# Patient Record
Sex: Female | Born: 1960 | Race: White | Hispanic: No | Marital: Married
Health system: Southern US, Community
[De-identification: ages and names within clinical notes are randomized; demographics above are authoritative.]

---

## 2002-08-24 ENCOUNTER — Emergency Department (HOSPITAL_COMMUNITY): Admission: EM | Admit: 2002-08-24 | Discharge: 2002-08-25 | Payer: Self-pay | Admitting: Emergency Medicine

## 2002-08-24 ENCOUNTER — Encounter: Payer: Self-pay | Admitting: Emergency Medicine

## 2003-04-15 ENCOUNTER — Other Ambulatory Visit: Admission: RE | Admit: 2003-04-15 | Discharge: 2003-04-15 | Payer: Self-pay | Admitting: Obstetrics and Gynecology

## 2003-04-25 ENCOUNTER — Encounter: Admission: RE | Admit: 2003-04-25 | Discharge: 2003-04-25 | Payer: Self-pay | Admitting: Obstetrics and Gynecology

## 2003-04-25 ENCOUNTER — Encounter: Payer: Self-pay | Admitting: Obstetrics and Gynecology

## 2003-05-06 ENCOUNTER — Other Ambulatory Visit: Admission: RE | Admit: 2003-05-06 | Discharge: 2003-05-06 | Payer: Self-pay | Admitting: Obstetrics and Gynecology

## 2004-04-01 ENCOUNTER — Other Ambulatory Visit: Admission: RE | Admit: 2004-04-01 | Discharge: 2004-04-01 | Payer: Self-pay | Admitting: Obstetrics and Gynecology

## 2005-04-01 ENCOUNTER — Encounter: Admission: RE | Admit: 2005-04-01 | Discharge: 2005-04-01 | Payer: Self-pay | Admitting: Obstetrics and Gynecology

## 2005-06-17 ENCOUNTER — Other Ambulatory Visit: Admission: RE | Admit: 2005-06-17 | Discharge: 2005-06-17 | Payer: Self-pay | Admitting: Obstetrics and Gynecology

## 2007-05-31 ENCOUNTER — Encounter: Admission: RE | Admit: 2007-05-31 | Discharge: 2007-05-31 | Payer: Self-pay | Admitting: Obstetrics and Gynecology

## 2008-05-31 ENCOUNTER — Encounter: Admission: RE | Admit: 2008-05-31 | Discharge: 2008-05-31 | Payer: Self-pay | Admitting: Obstetrics and Gynecology

## 2009-07-24 ENCOUNTER — Encounter: Admission: RE | Admit: 2009-07-24 | Discharge: 2009-07-24 | Payer: Self-pay | Admitting: Obstetrics and Gynecology

## 2010-08-31 ENCOUNTER — Other Ambulatory Visit: Payer: Self-pay | Admitting: Obstetrics and Gynecology

## 2010-08-31 DIAGNOSIS — Z1231 Encounter for screening mammogram for malignant neoplasm of breast: Secondary | ICD-10-CM

## 2010-09-09 ENCOUNTER — Ambulatory Visit
Admission: RE | Admit: 2010-09-09 | Discharge: 2010-09-09 | Disposition: A | Discharge status: Home / Self Care | Payer: Commercial Managed Care - PPO | Source: Ambulatory Visit | Attending: Obstetrics and Gynecology | Admitting: Obstetrics and Gynecology

## 2010-09-09 DIAGNOSIS — Z1231 Encounter for screening mammogram for malignant neoplasm of breast: Secondary | ICD-10-CM

## 2012-04-17 ENCOUNTER — Other Ambulatory Visit: Payer: Self-pay | Admitting: Obstetrics and Gynecology

## 2012-04-17 DIAGNOSIS — Z1231 Encounter for screening mammogram for malignant neoplasm of breast: Secondary | ICD-10-CM

## 2012-04-27 ENCOUNTER — Ambulatory Visit
Admission: RE | Admit: 2012-04-27 | Discharge: 2012-04-27 | Disposition: A | Discharge status: Home / Self Care | Payer: 59 | Source: Ambulatory Visit | Attending: Obstetrics and Gynecology | Admitting: Obstetrics and Gynecology

## 2012-04-27 DIAGNOSIS — Z1231 Encounter for screening mammogram for malignant neoplasm of breast: Secondary | ICD-10-CM

## 2013-04-24 ENCOUNTER — Other Ambulatory Visit: Payer: Self-pay

## 2013-04-24 DIAGNOSIS — Z1231 Encounter for screening mammogram for malignant neoplasm of breast: Secondary | ICD-10-CM

## 2013-05-18 ENCOUNTER — Ambulatory Visit
Admission: RE | Admit: 2013-05-18 | Discharge: 2013-05-18 | Disposition: A | Discharge status: Home / Self Care | Payer: 59 | Source: Ambulatory Visit

## 2013-05-18 DIAGNOSIS — Z1231 Encounter for screening mammogram for malignant neoplasm of breast: Secondary | ICD-10-CM

## 2014-07-30 ENCOUNTER — Other Ambulatory Visit: Payer: Self-pay

## 2014-07-30 DIAGNOSIS — Z1231 Encounter for screening mammogram for malignant neoplasm of breast: Secondary | ICD-10-CM

## 2014-08-05 ENCOUNTER — Ambulatory Visit
Admission: RE | Admit: 2014-08-05 | Discharge: 2014-08-05 | Disposition: A | Discharge status: Home / Self Care | Payer: 59 | Source: Ambulatory Visit

## 2014-08-05 DIAGNOSIS — Z1231 Encounter for screening mammogram for malignant neoplasm of breast: Secondary | ICD-10-CM

## 2015-10-02 ENCOUNTER — Other Ambulatory Visit: Payer: Self-pay

## 2015-10-02 DIAGNOSIS — Z1231 Encounter for screening mammogram for malignant neoplasm of breast: Secondary | ICD-10-CM

## 2015-10-08 ENCOUNTER — Ambulatory Visit
Admission: RE | Admit: 2015-10-08 | Discharge: 2015-10-08 | Disposition: A | Discharge status: Home / Self Care | Payer: Managed Care, Other (non HMO) | Source: Ambulatory Visit

## 2015-10-08 DIAGNOSIS — Z1231 Encounter for screening mammogram for malignant neoplasm of breast: Secondary | ICD-10-CM

## 2016-11-23 ENCOUNTER — Other Ambulatory Visit: Payer: Self-pay | Admitting: Obstetrics and Gynecology

## 2016-11-23 DIAGNOSIS — Z1231 Encounter for screening mammogram for malignant neoplasm of breast: Secondary | ICD-10-CM

## 2016-12-14 ENCOUNTER — Ambulatory Visit
Admission: RE | Admit: 2016-12-14 | Discharge: 2016-12-14 | Disposition: A | Discharge status: Home / Self Care | Payer: BLUE CROSS/BLUE SHIELD | Source: Ambulatory Visit | Attending: Obstetrics and Gynecology | Admitting: Obstetrics and Gynecology

## 2016-12-14 DIAGNOSIS — Z1231 Encounter for screening mammogram for malignant neoplasm of breast: Secondary | ICD-10-CM

## 2017-11-10 ENCOUNTER — Other Ambulatory Visit: Payer: Self-pay | Admitting: Obstetrics and Gynecology

## 2017-11-10 DIAGNOSIS — Z1231 Encounter for screening mammogram for malignant neoplasm of breast: Secondary | ICD-10-CM

## 2017-12-15 ENCOUNTER — Ambulatory Visit
Admission: RE | Admit: 2017-12-15 | Discharge: 2017-12-15 | Disposition: A | Discharge status: Home / Self Care | Payer: BLUE CROSS/BLUE SHIELD | Source: Ambulatory Visit | Attending: Obstetrics and Gynecology | Admitting: Obstetrics and Gynecology

## 2017-12-15 DIAGNOSIS — Z1231 Encounter for screening mammogram for malignant neoplasm of breast: Secondary | ICD-10-CM

## 2018-07-06 DIAGNOSIS — R87610 Atypical squamous cells of undetermined significance on cytologic smear of cervix (ASC-US): Secondary | ICD-10-CM | POA: Diagnosis not present

## 2018-07-06 DIAGNOSIS — Z01419 Encounter for gynecological examination (general) (routine) without abnormal findings: Secondary | ICD-10-CM | POA: Diagnosis not present

## 2018-11-29 ENCOUNTER — Other Ambulatory Visit: Payer: Self-pay | Admitting: Obstetrics and Gynecology

## 2018-11-29 DIAGNOSIS — Z1231 Encounter for screening mammogram for malignant neoplasm of breast: Secondary | ICD-10-CM

## 2019-02-05 ENCOUNTER — Other Ambulatory Visit: Payer: Self-pay

## 2019-02-05 ENCOUNTER — Ambulatory Visit
Admission: RE | Admit: 2019-02-05 | Discharge: 2019-02-05 | Disposition: A | Discharge status: Home / Self Care | Payer: BLUE CROSS/BLUE SHIELD | Source: Ambulatory Visit | Attending: Obstetrics and Gynecology | Admitting: Obstetrics and Gynecology

## 2019-02-05 DIAGNOSIS — Z1231 Encounter for screening mammogram for malignant neoplasm of breast: Secondary | ICD-10-CM | POA: Diagnosis not present

## 2019-02-19 DIAGNOSIS — H2513 Age-related nuclear cataract, bilateral: Secondary | ICD-10-CM | POA: Diagnosis not present

## 2019-02-19 DIAGNOSIS — B399 Histoplasmosis, unspecified: Secondary | ICD-10-CM | POA: Diagnosis not present

## 2019-02-19 DIAGNOSIS — H5213 Myopia, bilateral: Secondary | ICD-10-CM | POA: Diagnosis not present

## 2019-05-21 DIAGNOSIS — Z20828 Contact with and (suspected) exposure to other viral communicable diseases: Secondary | ICD-10-CM | POA: Diagnosis not present

## 2020-01-24 ENCOUNTER — Other Ambulatory Visit: Payer: Self-pay | Admitting: Obstetrics and Gynecology

## 2020-01-24 DIAGNOSIS — Z1231 Encounter for screening mammogram for malignant neoplasm of breast: Secondary | ICD-10-CM

## 2020-02-08 ENCOUNTER — Ambulatory Visit
Admission: RE | Admit: 2020-02-08 | Discharge: 2020-02-08 | Disposition: A | Discharge status: Home / Self Care | Payer: BC Managed Care – PPO | Source: Ambulatory Visit | Attending: Obstetrics and Gynecology | Admitting: Obstetrics and Gynecology

## 2020-02-08 ENCOUNTER — Other Ambulatory Visit: Payer: Self-pay

## 2020-02-08 DIAGNOSIS — Z1231 Encounter for screening mammogram for malignant neoplasm of breast: Secondary | ICD-10-CM

## 2020-04-10 DIAGNOSIS — Z01419 Encounter for gynecological examination (general) (routine) without abnormal findings: Secondary | ICD-10-CM | POA: Diagnosis not present

## 2020-04-14 DIAGNOSIS — M25571 Pain in right ankle and joints of right foot: Secondary | ICD-10-CM | POA: Diagnosis not present

## 2020-04-21 ENCOUNTER — Ambulatory Visit: Payer: BC Managed Care – PPO | Admitting: Podiatry

## 2020-05-08 DIAGNOSIS — E663 Overweight: Secondary | ICD-10-CM | POA: Diagnosis not present

## 2020-05-08 DIAGNOSIS — F439 Reaction to severe stress, unspecified: Secondary | ICD-10-CM | POA: Diagnosis not present

## 2020-05-08 DIAGNOSIS — I10 Essential (primary) hypertension: Secondary | ICD-10-CM | POA: Diagnosis not present

## 2020-05-08 DIAGNOSIS — S93409A Sprain of unspecified ligament of unspecified ankle, initial encounter: Secondary | ICD-10-CM | POA: Diagnosis not present

## 2020-05-26 DIAGNOSIS — E663 Overweight: Secondary | ICD-10-CM | POA: Diagnosis not present

## 2020-05-26 DIAGNOSIS — Z713 Dietary counseling and surveillance: Secondary | ICD-10-CM | POA: Diagnosis not present

## 2020-05-26 DIAGNOSIS — I10 Essential (primary) hypertension: Secondary | ICD-10-CM | POA: Diagnosis not present

## 2020-06-05 DIAGNOSIS — I10 Essential (primary) hypertension: Secondary | ICD-10-CM | POA: Diagnosis not present

## 2020-06-05 DIAGNOSIS — E663 Overweight: Secondary | ICD-10-CM | POA: Diagnosis not present

## 2020-06-18 DIAGNOSIS — I10 Essential (primary) hypertension: Secondary | ICD-10-CM | POA: Diagnosis not present

## 2020-06-18 DIAGNOSIS — E782 Mixed hyperlipidemia: Secondary | ICD-10-CM | POA: Diagnosis not present

## 2020-06-18 DIAGNOSIS — R739 Hyperglycemia, unspecified: Secondary | ICD-10-CM | POA: Diagnosis not present

## 2020-06-18 DIAGNOSIS — E663 Overweight: Secondary | ICD-10-CM | POA: Diagnosis not present

## 2020-06-26 DIAGNOSIS — E669 Obesity, unspecified: Secondary | ICD-10-CM | POA: Diagnosis not present

## 2020-06-26 DIAGNOSIS — R7303 Prediabetes: Secondary | ICD-10-CM | POA: Diagnosis not present

## 2020-06-26 DIAGNOSIS — E785 Hyperlipidemia, unspecified: Secondary | ICD-10-CM | POA: Diagnosis not present

## 2020-06-26 DIAGNOSIS — I10 Essential (primary) hypertension: Secondary | ICD-10-CM | POA: Diagnosis not present

## 2020-08-07 DIAGNOSIS — R7303 Prediabetes: Secondary | ICD-10-CM | POA: Diagnosis not present

## 2020-08-07 DIAGNOSIS — E669 Obesity, unspecified: Secondary | ICD-10-CM | POA: Diagnosis not present

## 2020-08-07 DIAGNOSIS — E785 Hyperlipidemia, unspecified: Secondary | ICD-10-CM | POA: Diagnosis not present

## 2020-08-07 DIAGNOSIS — I10 Essential (primary) hypertension: Secondary | ICD-10-CM | POA: Diagnosis not present

## 2020-09-10 DIAGNOSIS — E669 Obesity, unspecified: Secondary | ICD-10-CM | POA: Diagnosis not present

## 2020-09-10 DIAGNOSIS — E785 Hyperlipidemia, unspecified: Secondary | ICD-10-CM | POA: Diagnosis not present

## 2020-09-10 DIAGNOSIS — I10 Essential (primary) hypertension: Secondary | ICD-10-CM | POA: Diagnosis not present

## 2020-09-10 DIAGNOSIS — R7303 Prediabetes: Secondary | ICD-10-CM | POA: Diagnosis not present

## 2020-10-20 DIAGNOSIS — I10 Essential (primary) hypertension: Secondary | ICD-10-CM | POA: Diagnosis not present

## 2020-10-20 DIAGNOSIS — E785 Hyperlipidemia, unspecified: Secondary | ICD-10-CM | POA: Diagnosis not present

## 2020-10-20 DIAGNOSIS — E663 Overweight: Secondary | ICD-10-CM | POA: Diagnosis not present

## 2020-10-20 DIAGNOSIS — R7303 Prediabetes: Secondary | ICD-10-CM | POA: Diagnosis not present

## 2020-12-01 DIAGNOSIS — I10 Essential (primary) hypertension: Secondary | ICD-10-CM | POA: Diagnosis not present

## 2020-12-22 DIAGNOSIS — Z0000 Encounter for general adult medical examination without abnormal findings: Secondary | ICD-10-CM | POA: Diagnosis not present

## 2020-12-22 DIAGNOSIS — E785 Hyperlipidemia, unspecified: Secondary | ICD-10-CM | POA: Diagnosis not present

## 2020-12-25 DIAGNOSIS — I10 Essential (primary) hypertension: Secondary | ICD-10-CM | POA: Diagnosis not present

## 2020-12-25 DIAGNOSIS — Z0000 Encounter for general adult medical examination without abnormal findings: Secondary | ICD-10-CM | POA: Diagnosis not present

## 2020-12-25 DIAGNOSIS — Z23 Encounter for immunization: Secondary | ICD-10-CM | POA: Diagnosis not present

## 2020-12-25 DIAGNOSIS — Z1211 Encounter for screening for malignant neoplasm of colon: Secondary | ICD-10-CM | POA: Diagnosis not present

## 2020-12-25 DIAGNOSIS — H6121 Impacted cerumen, right ear: Secondary | ICD-10-CM | POA: Diagnosis not present

## 2021-01-26 DIAGNOSIS — Z23 Encounter for immunization: Secondary | ICD-10-CM | POA: Diagnosis not present

## 2021-03-03 DIAGNOSIS — E663 Overweight: Secondary | ICD-10-CM | POA: Diagnosis not present

## 2021-03-03 DIAGNOSIS — I10 Essential (primary) hypertension: Secondary | ICD-10-CM | POA: Diagnosis not present

## 2021-03-17 ENCOUNTER — Other Ambulatory Visit: Payer: Self-pay | Admitting: Obstetrics and Gynecology

## 2021-03-17 DIAGNOSIS — Z1231 Encounter for screening mammogram for malignant neoplasm of breast: Secondary | ICD-10-CM

## 2021-04-15 ENCOUNTER — Ambulatory Visit
Admission: RE | Admit: 2021-04-15 | Discharge: 2021-04-15 | Disposition: A | Discharge status: Home / Self Care | Payer: BC Managed Care – PPO | Source: Ambulatory Visit | Attending: Obstetrics and Gynecology | Admitting: Obstetrics and Gynecology

## 2021-04-15 ENCOUNTER — Other Ambulatory Visit: Payer: Self-pay

## 2021-04-15 DIAGNOSIS — Z1231 Encounter for screening mammogram for malignant neoplasm of breast: Secondary | ICD-10-CM

## 2021-06-05 ENCOUNTER — Ambulatory Visit: Payer: BC Managed Care – PPO | Attending: Internal Medicine

## 2021-06-05 ENCOUNTER — Other Ambulatory Visit (HOSPITAL_BASED_OUTPATIENT_CLINIC_OR_DEPARTMENT_OTHER): Payer: Self-pay

## 2021-06-05 DIAGNOSIS — Z23 Encounter for immunization: Secondary | ICD-10-CM

## 2021-06-05 MED ORDER — MODERNA COVID-19 BIVAL BOOSTER 50 MCG/0.5ML IM SUSP
INTRAMUSCULAR | 0 refills | Status: AC
  Filled 2021-06-05: qty 0.5, 1d supply, fill #0

## 2021-06-05 NOTE — Progress Notes (Signed)
   Cov[facility] Vaccination [facility]  Name:  Stacy Robertson    MRN: [id number] DOB: [birth year]  06/05/2021  [name] was observed post Covid-19 immunization for 15 minutes without incident. She was provided with Vaccine Information Sheet and instruction to access the V-Safe system.   [name] was instructed to call 911 with any severe reactions post vaccine: Difficulty breathing  Swelling of face and throat  A fast heartbeat  A bad rash all over body  Dizziness and weakness   Immunizations Administered     Name Date Dose VIS Date Route   Moderna Covid-19 vaccine Bivalent Booster 06/05/2021  8:40 AM 0.5 mL 02/28/2021 Intramuscular   Manufacturer: [employer]ala Murdoch   Lot: [id number]   NDC: 23953-202-33

## 2021-07-01 DIAGNOSIS — Z23 Encounter for immunization: Secondary | ICD-10-CM | POA: Diagnosis not present

## 2021-07-01 DIAGNOSIS — E782 Mixed hyperlipidemia: Secondary | ICD-10-CM | POA: Diagnosis not present

## 2021-07-03 DIAGNOSIS — E782 Mixed hyperlipidemia: Secondary | ICD-10-CM | POA: Diagnosis not present

## 2021-07-03 DIAGNOSIS — I10 Essential (primary) hypertension: Secondary | ICD-10-CM | POA: Diagnosis not present

## 2021-08-07 DIAGNOSIS — Z6828 Body mass index (BMI) 28.0-28.9, adult: Secondary | ICD-10-CM | POA: Diagnosis not present

## 2021-08-07 DIAGNOSIS — Z01419 Encounter for gynecological examination (general) (routine) without abnormal findings: Secondary | ICD-10-CM | POA: Diagnosis not present

## 2021-12-25 DIAGNOSIS — Z0000 Encounter for general adult medical examination without abnormal findings: Secondary | ICD-10-CM | POA: Diagnosis not present

## 2021-12-25 DIAGNOSIS — Z114 Encounter for screening for human immunodeficiency virus [HIV]: Secondary | ICD-10-CM | POA: Diagnosis not present

## 2021-12-25 DIAGNOSIS — Z13220 Encounter for screening for lipoid disorders: Secondary | ICD-10-CM | POA: Diagnosis not present

## 2021-12-29 DIAGNOSIS — H6121 Impacted cerumen, right ear: Secondary | ICD-10-CM | POA: Diagnosis not present

## 2021-12-29 DIAGNOSIS — Z0000 Encounter for general adult medical examination without abnormal findings: Secondary | ICD-10-CM | POA: Diagnosis not present

## 2021-12-29 DIAGNOSIS — E782 Mixed hyperlipidemia: Secondary | ICD-10-CM | POA: Diagnosis not present

## 2021-12-29 DIAGNOSIS — Z23 Encounter for immunization: Secondary | ICD-10-CM | POA: Diagnosis not present

## 2021-12-29 DIAGNOSIS — Z1211 Encounter for screening for malignant neoplasm of colon: Secondary | ICD-10-CM | POA: Diagnosis not present

## 2022-03-31 DIAGNOSIS — E782 Mixed hyperlipidemia: Secondary | ICD-10-CM | POA: Diagnosis not present

## 2022-03-31 DIAGNOSIS — E663 Overweight: Secondary | ICD-10-CM | POA: Diagnosis not present

## 2022-03-31 DIAGNOSIS — I10 Essential (primary) hypertension: Secondary | ICD-10-CM | POA: Diagnosis not present

## 2022-05-26 ENCOUNTER — Other Ambulatory Visit: Payer: Self-pay | Admitting: Obstetrics and Gynecology

## 2022-05-26 DIAGNOSIS — Z1231 Encounter for screening mammogram for malignant neoplasm of breast: Secondary | ICD-10-CM

## 2022-05-31 ENCOUNTER — Ambulatory Visit
Admission: RE | Admit: 2022-05-31 | Discharge: 2022-05-31 | Disposition: A | Discharge status: Home / Self Care | Payer: BC Managed Care – PPO | Source: Ambulatory Visit | Attending: Obstetrics and Gynecology | Admitting: Obstetrics and Gynecology

## 2022-05-31 DIAGNOSIS — Z1231 Encounter for screening mammogram for malignant neoplasm of breast: Secondary | ICD-10-CM

## 2022-07-29 DIAGNOSIS — E559 Vitamin D deficiency, unspecified: Secondary | ICD-10-CM | POA: Diagnosis not present

## 2022-07-29 DIAGNOSIS — E785 Hyperlipidemia, unspecified: Secondary | ICD-10-CM | POA: Diagnosis not present

## 2022-07-29 DIAGNOSIS — Z23 Encounter for immunization: Secondary | ICD-10-CM | POA: Diagnosis not present

## 2022-08-05 DIAGNOSIS — E782 Mixed hyperlipidemia: Secondary | ICD-10-CM | POA: Diagnosis not present

## 2022-08-05 DIAGNOSIS — I10 Essential (primary) hypertension: Secondary | ICD-10-CM | POA: Diagnosis not present

## 2022-08-05 DIAGNOSIS — E559 Vitamin D deficiency, unspecified: Secondary | ICD-10-CM | POA: Diagnosis not present

## 2022-08-05 DIAGNOSIS — J069 Acute upper respiratory infection, unspecified: Secondary | ICD-10-CM | POA: Diagnosis not present

## 2022-11-23 DIAGNOSIS — E782 Mixed hyperlipidemia: Secondary | ICD-10-CM | POA: Diagnosis not present

## 2022-11-23 DIAGNOSIS — I10 Essential (primary) hypertension: Secondary | ICD-10-CM | POA: Diagnosis not present

## 2022-11-23 DIAGNOSIS — E559 Vitamin D deficiency, unspecified: Secondary | ICD-10-CM | POA: Diagnosis not present

## 2022-12-03 DIAGNOSIS — E559 Vitamin D deficiency, unspecified: Secondary | ICD-10-CM | POA: Diagnosis not present

## 2022-12-03 DIAGNOSIS — M8580 Other specified disorders of bone density and structure, unspecified site: Secondary | ICD-10-CM | POA: Diagnosis not present

## 2022-12-03 DIAGNOSIS — I10 Essential (primary) hypertension: Secondary | ICD-10-CM | POA: Diagnosis not present

## 2022-12-03 DIAGNOSIS — E782 Mixed hyperlipidemia: Secondary | ICD-10-CM | POA: Diagnosis not present

## 2022-12-15 ENCOUNTER — Other Ambulatory Visit: Payer: Self-pay | Admitting: Obstetrics and Gynecology

## 2022-12-15 DIAGNOSIS — Z13820 Encounter for screening for osteoporosis: Secondary | ICD-10-CM

## 2023-02-03 DIAGNOSIS — Z6829 Body mass index (BMI) 29.0-29.9, adult: Secondary | ICD-10-CM | POA: Diagnosis not present

## 2023-02-03 DIAGNOSIS — Z124 Encounter for screening for malignant neoplasm of cervix: Secondary | ICD-10-CM | POA: Diagnosis not present

## 2023-02-03 DIAGNOSIS — Z01419 Encounter for gynecological examination (general) (routine) without abnormal findings: Secondary | ICD-10-CM | POA: Diagnosis not present

## 2023-06-04 IMAGING — MG MM DIGITAL SCREENING BILAT W/ TOMO AND CAD
8 series · 8 of 24 positions shown · non-contrast
Comparison: Previous exam(s).

CLINICAL DATA: Screening.

EXAM:
DIGITAL SCREENING BILATERAL MAMMOGRAM WITH TOMOSYNTHESIS AND CAD
TECHNIQUE: Bilateral screening digital craniocaudal and mediolateral oblique
mammograms were obtained. Bilateral screening digital breast
tomosynthesis was performed. The images were evaluated with
computer-aided detection.

[R MLO synth-2D]
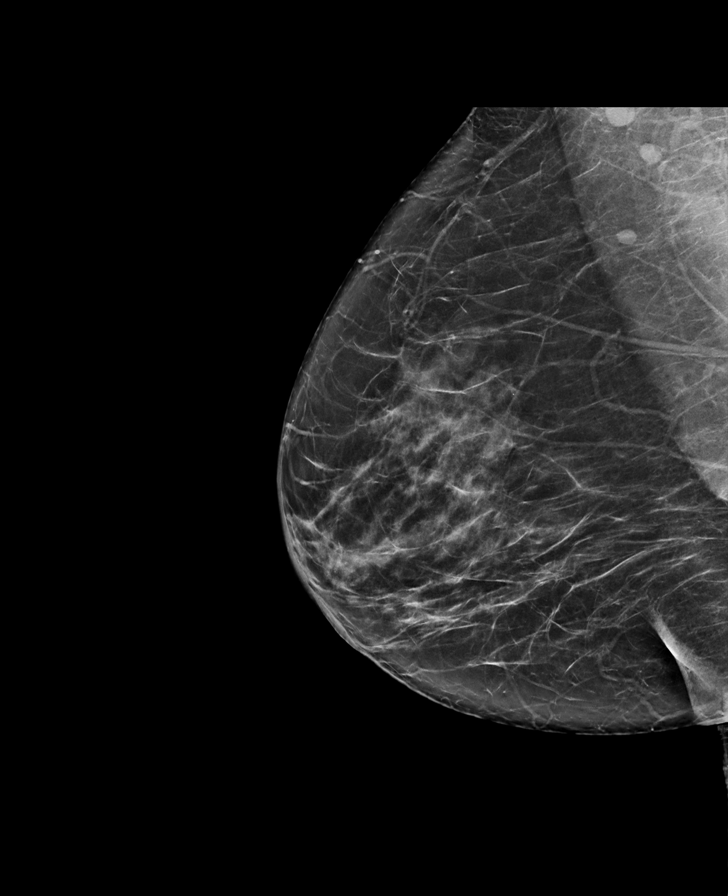

[R CC synth-2D]
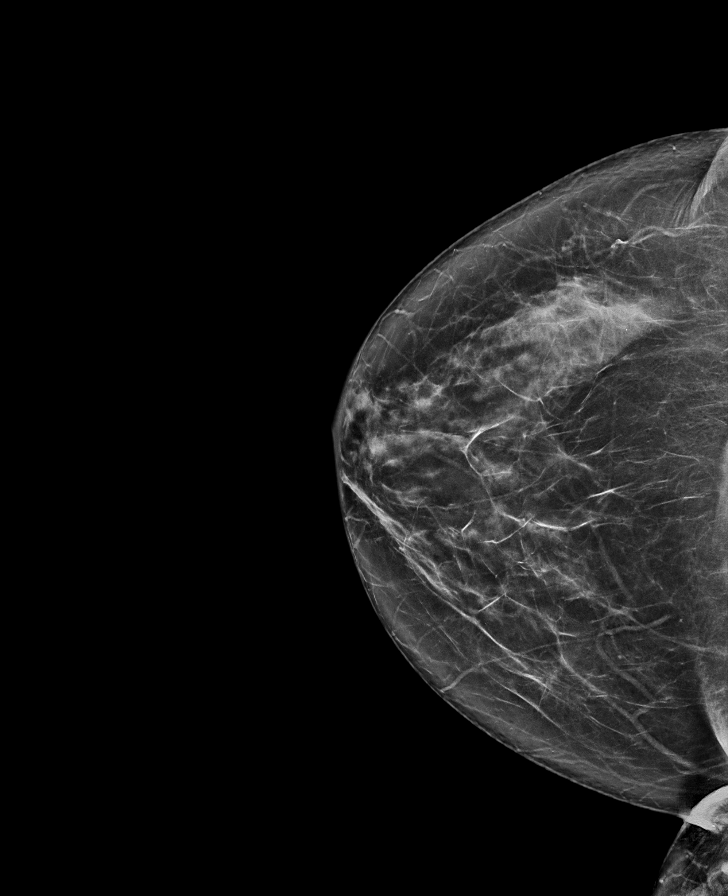

[L MLO synth-2D]
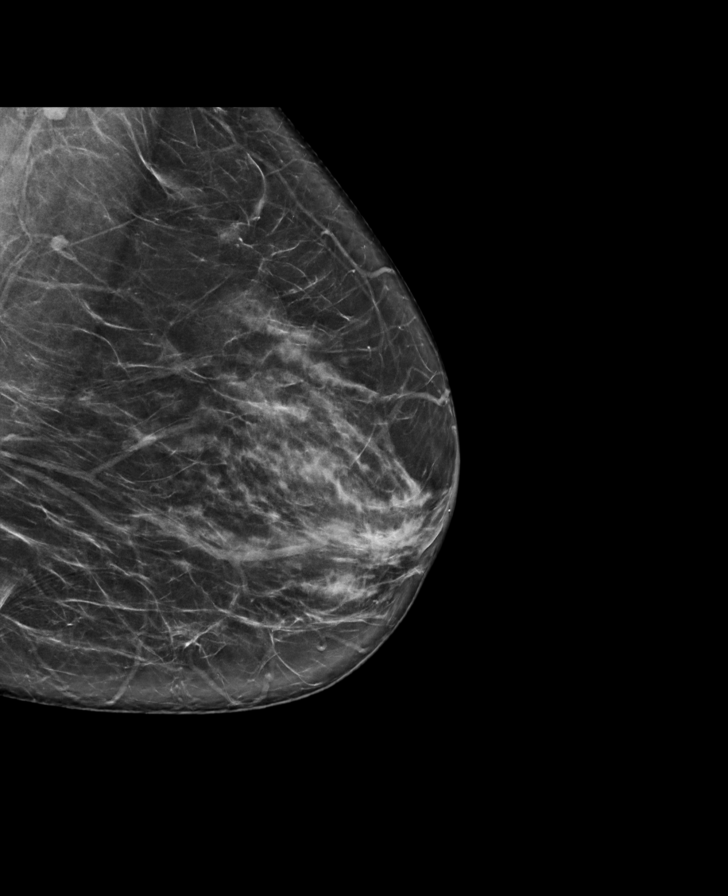

[L CC synth-2D]
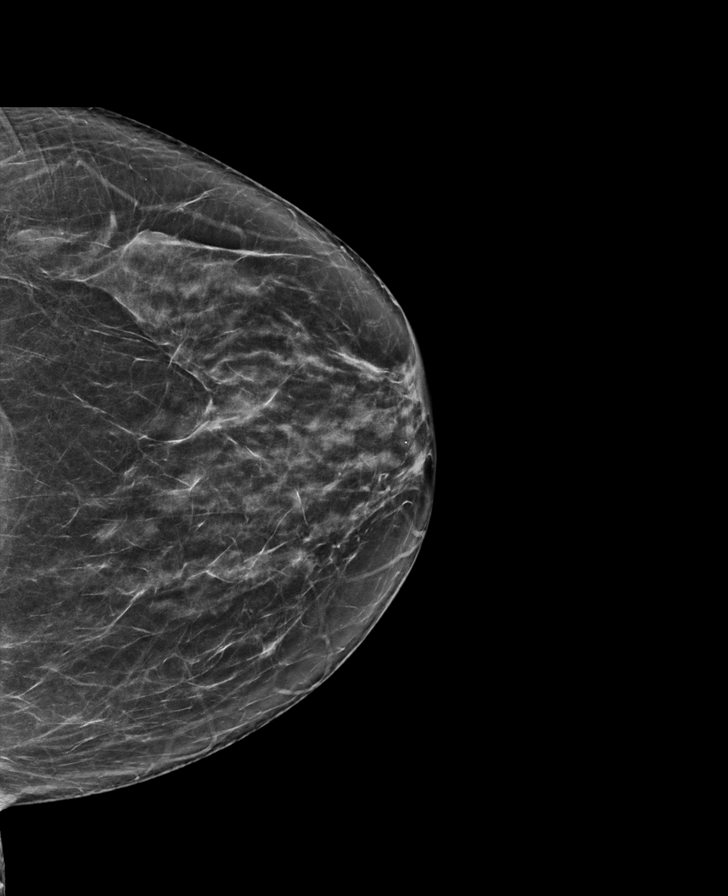

[L MLO tomo · tomo slice 40/79.0]
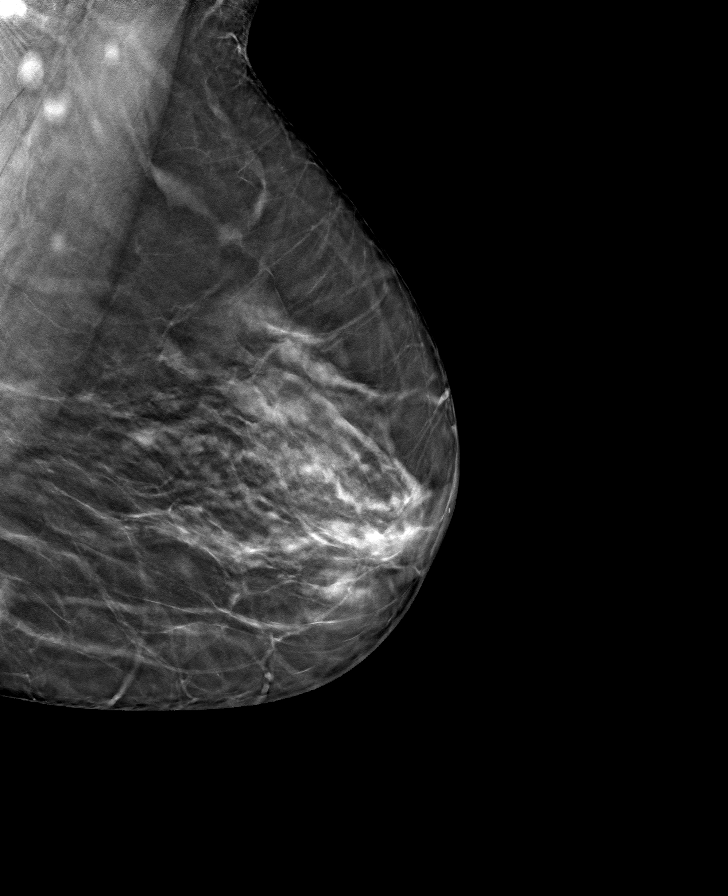

[L CC tomo · tomo slice 36/71.0]
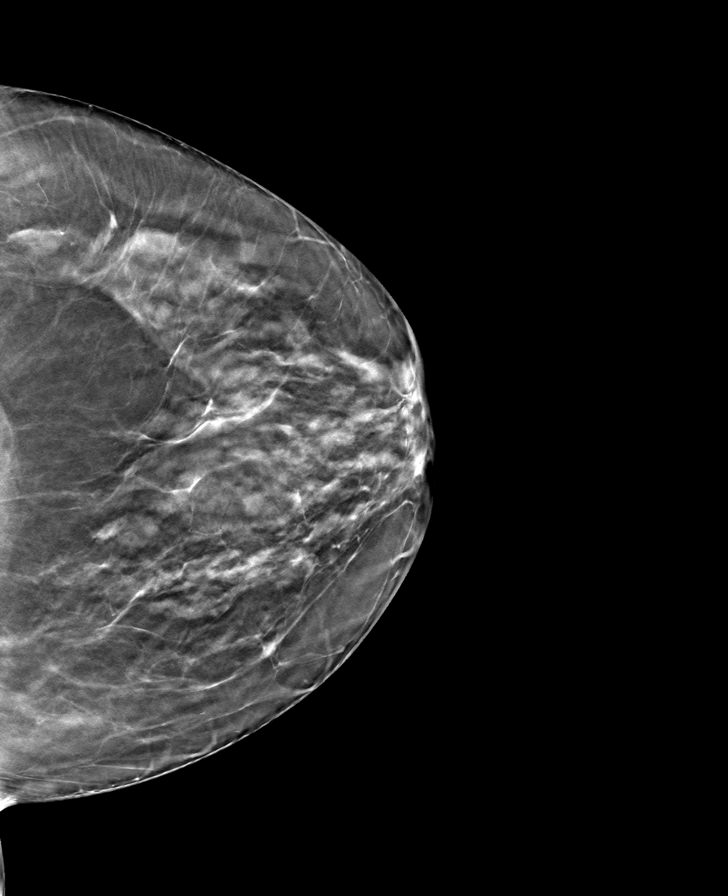

[R CC tomo · tomo slice 39/77.0]
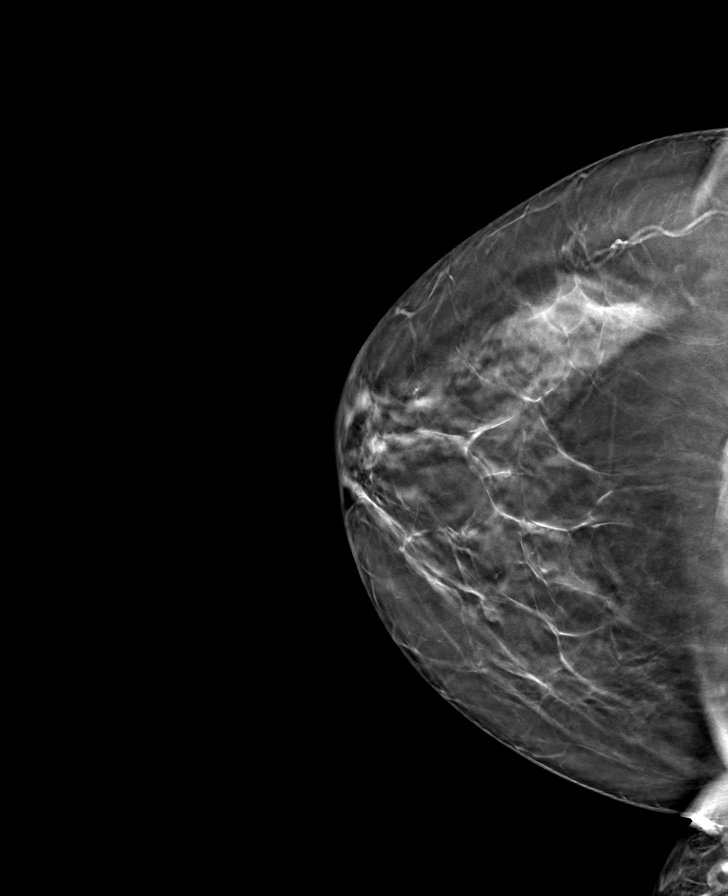

[R MLO tomo · tomo slice 39/77.0]
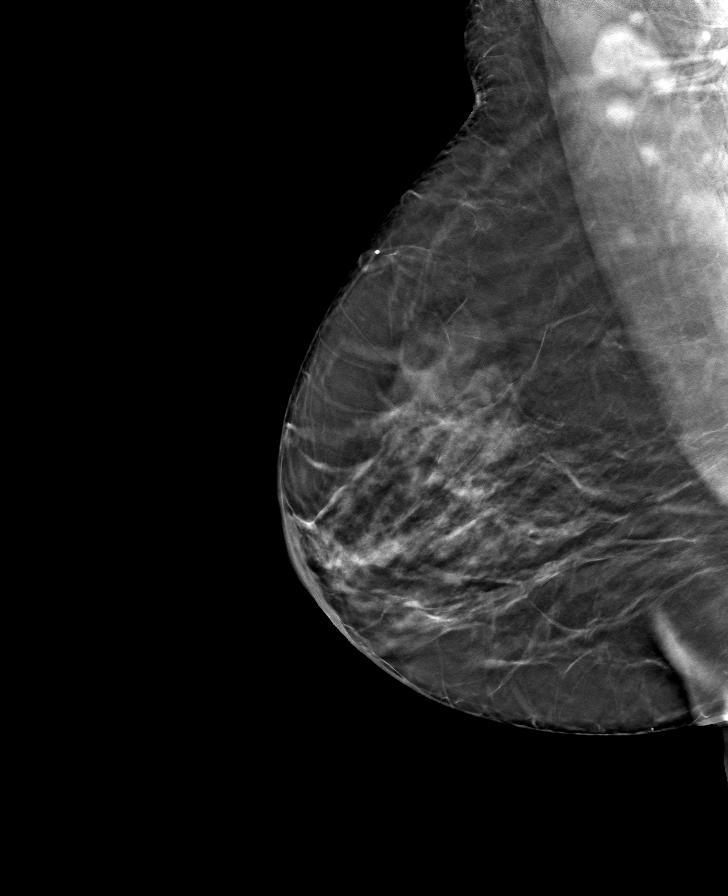

[8 of 24 positions shown; findings below may reference images not displayed]

ACR Breast Density Category b: There are scattered areas of
fibroglandular density.
FINDINGS: There are no findings suspicious for malignancy.
IMPRESSION: No mammographic evidence of malignancy. A result letter of this
screening mammogram will be mailed directly to the patient.

RECOMMENDATION:
Screening mammogram in one year. (Code:51-O-LD2)

BI-RADS CATEGORY  1: Negative.

## 2023-07-08 ENCOUNTER — Other Ambulatory Visit: Payer: BC Managed Care – PPO

## 2023-08-02 ENCOUNTER — Other Ambulatory Visit: Payer: Self-pay | Admitting: Obstetrics and Gynecology

## 2023-08-02 DIAGNOSIS — Z1231 Encounter for screening mammogram for malignant neoplasm of breast: Secondary | ICD-10-CM

## 2023-08-04 ENCOUNTER — Ambulatory Visit
Admission: RE | Admit: 2023-08-04 | Discharge: 2023-08-04 | Disposition: A | Discharge status: Home / Self Care | Payer: Managed Care, Other (non HMO) | Source: Ambulatory Visit | Attending: Obstetrics and Gynecology | Admitting: Obstetrics and Gynecology

## 2023-08-04 DIAGNOSIS — Z1231 Encounter for screening mammogram for malignant neoplasm of breast: Secondary | ICD-10-CM

## 2023-08-05 ENCOUNTER — Ambulatory Visit
Admission: RE | Admit: 2023-08-05 | Discharge: 2023-08-05 | Disposition: A | Discharge status: Home / Self Care | Payer: Managed Care, Other (non HMO) | Source: Ambulatory Visit | Attending: Obstetrics and Gynecology | Admitting: Obstetrics and Gynecology

## 2023-08-05 DIAGNOSIS — Z13820 Encounter for screening for osteoporosis: Secondary | ICD-10-CM

## 2024-05-30 ENCOUNTER — Other Ambulatory Visit: Payer: Self-pay | Admitting: Obstetrics and Gynecology

## 2024-05-30 DIAGNOSIS — Z1231 Encounter for screening mammogram for malignant neoplasm of breast: Secondary | ICD-10-CM

## 2024-08-07 ENCOUNTER — Ambulatory Visit
Admission: RE | Admit: 2024-08-07 | Discharge: 2024-08-07 | Disposition: A | Source: Ambulatory Visit | Attending: Obstetrics and Gynecology | Admitting: Obstetrics and Gynecology

## 2024-08-07 DIAGNOSIS — Z1231 Encounter for screening mammogram for malignant neoplasm of breast: Secondary | ICD-10-CM
